# Patient Record
Sex: Female | Born: 1985 | Hispanic: Refuse to answer | Marital: Married
Health system: Midwestern US, Community
[De-identification: ages and names within clinical notes are randomized; demographics above are authoritative.]

## PROBLEM LIST (undated history)

## (undated) DIAGNOSIS — N926 Irregular menstruation, unspecified: Secondary | ICD-10-CM

---

## 2020-11-07 LAB — COMPREHENSIVE METABOLIC PANEL
ALT: 15 U/L (ref 0–35)
AST: 23 U/L (ref 0–35)
Albumin/Globulin Ratio: 1.6 (ref 1.00–2.70)
Albumin: 4.5 g/dL (ref 3.5–5.2)
Alk Phosphatase: 96 U/L (ref 35–117)
Anion Gap: 11 mmol/L (ref 2–17)
BUN: 7 mg/dL (ref 6–20)
Bun/Cre Ratio: 10 (ref 6.0–17.0)
CO2: 25 mmol/L (ref 22–29)
Calcium: 9.3 mg/dL (ref 8.6–10.0)
Chloride: 100 mmol/L (ref 98–107)
Creatinine: 0.7 mg/dL (ref 0.5–1.0)
Est, Glom Filt Rate: 116 mL/min/1.73m?? (ref >=90)
Globulin: 2.8 g/dL (ref 1.9–4.4)
Glucose: 90 mg/dL (ref 70–99)
OSMOLALITY CALCULATED: 269 mOsm/kg — ABNORMAL LOW (ref 270–287)
Potassium: 4.6 mmol/L (ref 3.5–5.3)
Sodium: 136 mmol/L (ref 135–145)
Total Bilirubin: 0.37 mg/dL (ref 0.00–1.20)
Total Protein: 7.3 g/dL (ref 6.4–8.3)

## 2020-11-07 LAB — CBC
Hematocrit: 40.7 % (ref 34.0–47.0)
Hemoglobin: 13.9 g/dL (ref 11.5–15.7)
MCH: 32.9 pg (ref 27.0–34.5)
MCHC: 34.2 g/dL (ref 32.0–36.0)
MCV: 96.4 fL (ref 81.0–99.0)
MPV: 12.1 fL (ref 7.2–13.2)
NRBC Absolute: 0 10*3/uL (ref 0.000–0.012)
NRBC Automated: 0 % (ref 0.0–0.2)
Platelets: 281 10*3/uL (ref 140–440)
RBC: 4.22 x10e6/mcL (ref 3.60–5.20)
RDW: 11.7 % (ref 11.0–16.0)
WBC: 8.8 10*3/uL (ref 3.8–10.6)

## 2020-11-07 LAB — TSH WITH REFLEX TO FT4: TSH: 2.38 mcIU/mL (ref 0.358–3.740)

## 2020-11-07 LAB — URINALYSIS W/ RFLX MICROSCOPIC
Bilirubin Urine: NEGATIVE
Blood, Urine: NEGATIVE
Glucose, UA: NEGATIVE
Ketones, Urine: NEGATIVE
Leukocyte Esterase, Urine: NEGATIVE
Nitrite, Urine: NEGATIVE
Protein, UA: NEGATIVE
Specific Gravity, UA: 1.025 (ref 1.003–1.035)
Urobilinogen, Urine: 0.2 EU/dL
pH, UA: 6 (ref 4.5–8.0)

## 2020-11-08 LAB — PROLACTIN: Prolactin: 11.4 ng/mL (ref 4.79–23.30)

## 2020-11-12 LAB — HCG, QUANTITATIVE, PREGNANCY: hCG Quant: <1 m[IU]/mL (ref 0.0–5.3)

## 2020-12-23 ENCOUNTER — Encounter

## 2020-12-24 ENCOUNTER — Ambulatory Visit: Admit: 2020-12-24 | Discharge: 2020-12-24 | Payer: PRIVATE HEALTH INSURANCE | Primary: Family Medicine

## 2020-12-24 ENCOUNTER — Ambulatory Visit
Admit: 2020-12-24 | Discharge: 2020-12-24 | Payer: PRIVATE HEALTH INSURANCE | Attending: Obstetrics & Gynecology | Primary: Family Medicine

## 2020-12-24 DIAGNOSIS — N926 Irregular menstruation, unspecified: Secondary | ICD-10-CM

## 2020-12-24 NOTE — Assessment & Plan Note (Addendum)
-   exercising daily and working on nutrition

## 2020-12-24 NOTE — Progress Notes (Signed)
Kim Hughes (DOB:  03/18/1986) is a 34 y.o.,Established patient, here for evaluation of the following chief complaint(s):  Ultrasound         ASSESSMENT/PLAN:  1. Irregular menses  Assessment & Plan:   improved, patient started cycle this month   [place] reviewed - uterus and ovaries appear normal  2. At risk for fertility problems  3. BMI 34.0-34.9,adult  Assessment & Plan:  - exercising daily and working on nutrition      No follow-ups on file.   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 Since last visit, patient had a day of spotting on 7/4 and then a normal period on 12/11/20 with 7 days of heavy bleeding. She notes bleeding was heavier than normal. She is going to [place] to do her fertility treatment and plans to have annual exam there in Sept. She is continuing to work on weight loss with exercise and dietary modifications.     Prior Hx:  - 12/04/20: Initial visit to discuss absent menses and fertility planning. She was having regular cycles until May. She missed her period June and thought she was pregnant as she had associated breast tenderness and bloating, but all UPTs were neg. She did not have a blood level checked. In July she had 3 days of spotting with heavier  spotting on the 2nd day. She was previously told that it would be difficult to get pregnant due to kidney problems that she was born with. She has a nephrologist, but has not had any renal imaging in several years. She has never had imaging of her pelvic organs. She drinks 3.5L water/day to avoid dehydration.     Fhx: father with thyroid cancer and testicular cancer.    Review of Systems  Review of Systems   Constitutional:  Negative for chills and fever.   HENT: Negative.     Eyes: Negative.    Respiratory: Negative.  Negative for cough and  shortness of breath.    Cardiovascular:  Negative for chest pain, palpitations and leg swelling.   Gastrointestinal:  Negative for abdominal pain, diarrhea, nausea and vomiting.   Endocrine: Negative for cold intolerance and heat intolerance.   Genitourinary:  Negative for difficulty urinating, dysuria, flank pain, frequency, hematuria and pelvic pain.   Musculoskeletal: Negative.    Psychiatric/Behavioral: Negative.            Objective   Vitals:    12/24/20 1002   BP: 115/79   Pulse: 76   Weight: 184 lb (83.5 kg)   Height: 5\' 1"  (1.549 m)       Physical Exam  Physical Exam  Constitutional:       Appearance: Normal appearance.   HENT:      Head: Normocephalic and atraumatic.   Cardiovascular:      Rate and Rhythm: Normal rate.   Abdominal:      General: There is no distension.      Palpations: There is no mass.      Tenderness: There is no abdominal tenderness.   Neurological:      Mental Status: She is alert.   Psychiatric:         Mood and Affect: Mood normal.         Behavior: Behavior normal.  An electronic signature was used to authenticate this note.    --Carolyne Littles, MD

## 2020-12-24 NOTE — Assessment & Plan Note (Signed)
improved, patient started cycle this month   [place] reviewed - uterus and ovaries appear normal

## 2022-10-08 IMAGING — MR MRI KNEE LT W/O CONTRAST
9 series · 40 of 40 positions shown · non-contrast
Comparison: none

﻿

MAGNETIC RESONANCE IMAGING OF THE LEFT KNEE WITHOUT INTRAVENOUS CONTRAST ADMINISTRATION
HISTORY: Knee pain.  Anterior knee pain 6 months post-fall.
TECHNIQUE: Multiplanar magnetic resonance imaging of the left knee was accomplished employing surface coil and an open 0.3Christiti Sundhu scanner without intravenous contrast administration.  T1-weighted, intermediate intensity, and T2-weighted thin slice sections obtained.

[Series 1: t/s/c scano · axial · left · 6.0mm · 0.94mm/px · z∈[-16,+120]mm · 2 of 15 slices shown]
[im 1/15]
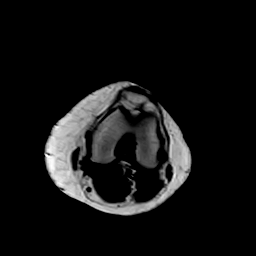
[im 15/15]
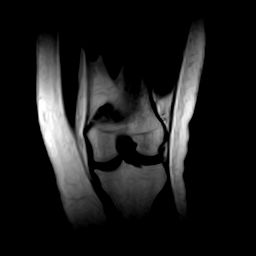

[Series 2: T2 · axial · left · 4.0mm · 0.70mm/px · z∈[-92,+22]mm · 5 of 24 slices shown (1 of 2)]
[im 1/24]
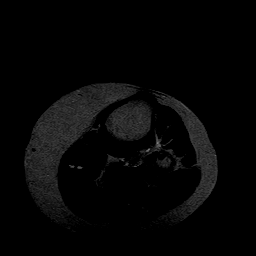
[im 6/24]
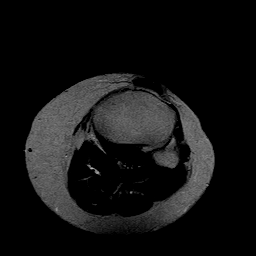
[im 12/24]
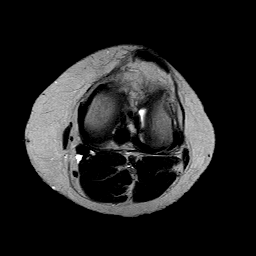
[im 18/24]
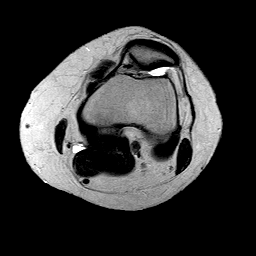
[im 24/24]
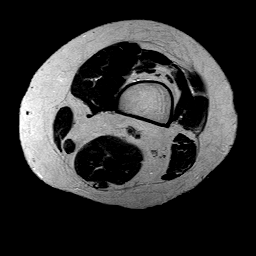

[Series 3: T1 · coronal · left · 4.0mm · 0.35mm/px · 4 of 22 slices shown]
[im 1/22]
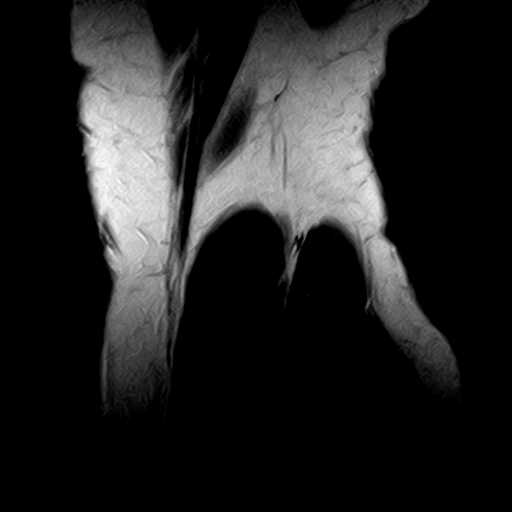
[im 8/22]
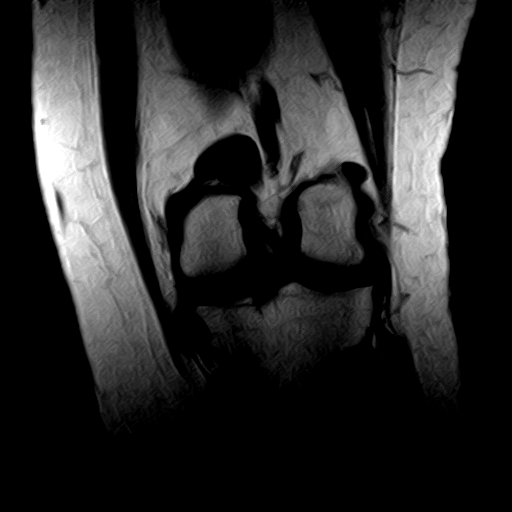
[im 15/22]
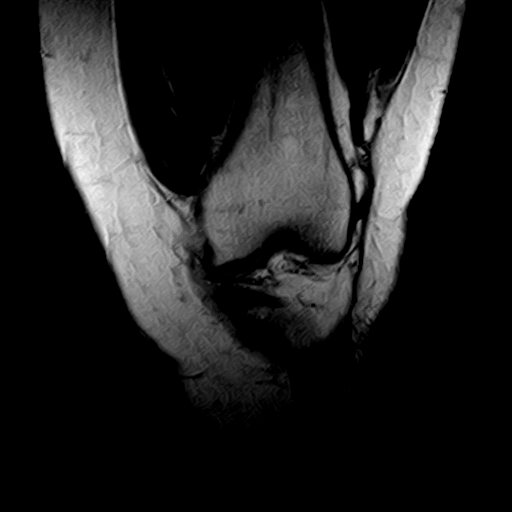
[im 22/22]
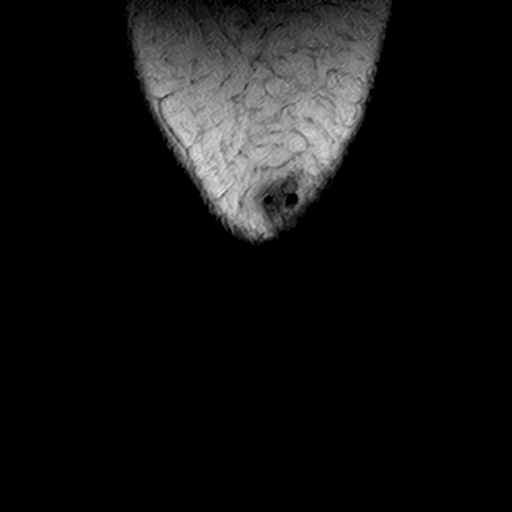

[Series 4: shim sag · sagittal · left · 10.0mm · 4.69mm/px · 1 of 6 slices shown]
[im 1/6]
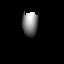

[Series 5: fatsepg mtc cor · coronal · left · 4.0mm · 0.70mm/px · 9 of 44 slices shown]
[im 1/44]
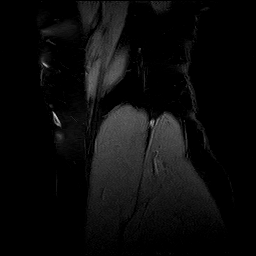
[im 6/44]
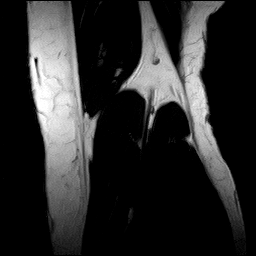
[im 11/44]
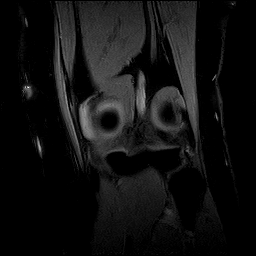
[im 17/44]
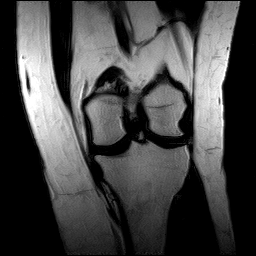
[im 22/44]
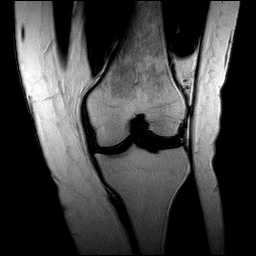
[im 27/44]
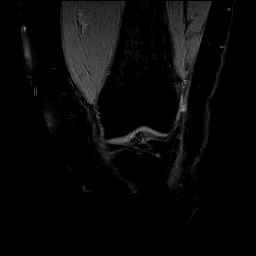
[im 33/44]
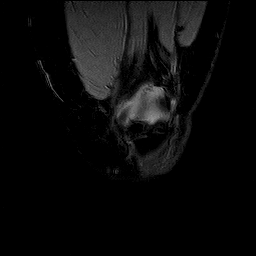
[im 38/44]
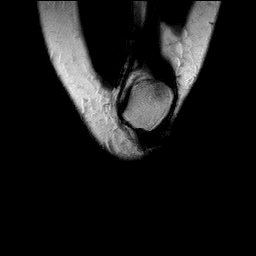
[im 44/44]
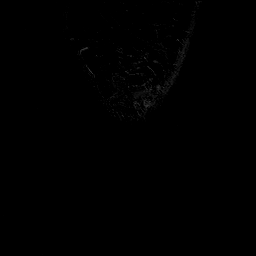

[Series 6: T2 · sagittal · left · 4.0mm · 0.70mm/px · 5 of 24 slices shown (2 of 2)]
[im 1/24]
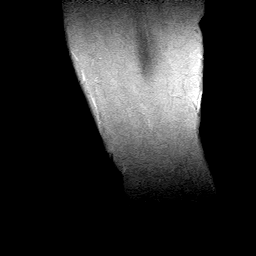
[im 6/24]
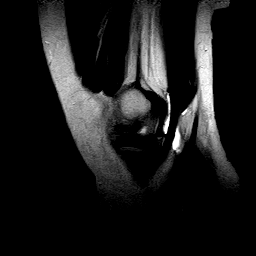
[im 12/24]
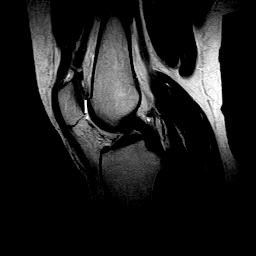
[im 18/24]
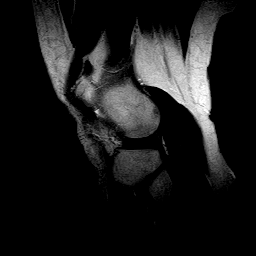
[im 24/24]
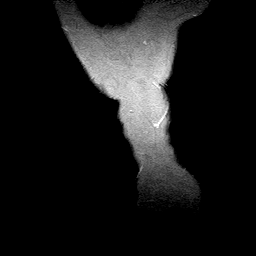

[Series 7: (id) · coronal · left · 4.0mm · 0.70mm/px · 4 of 22 slices shown]
[im 1/22]
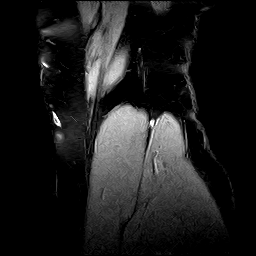
[im 8/22]
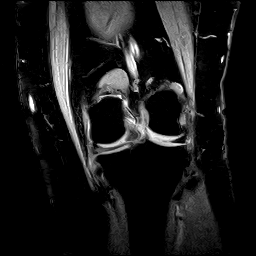
[im 15/22]
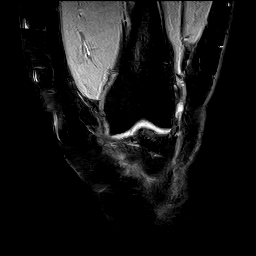
[im 22/22]
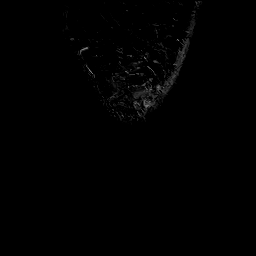

[Series 8: PD · sagittal · left · 4.0mm · 0.35mm/px · 5 of 24 slices shown]
[im 1/24]
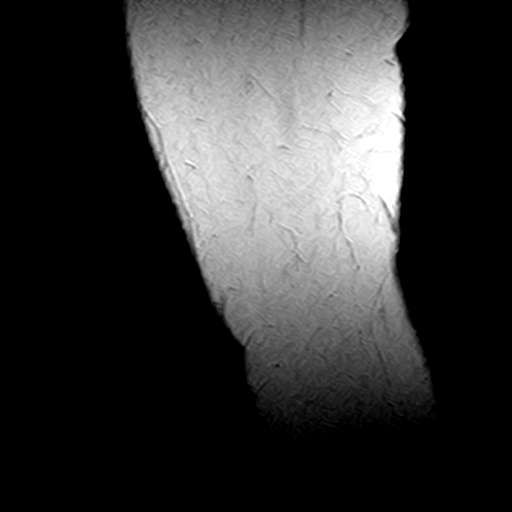
[im 6/24]
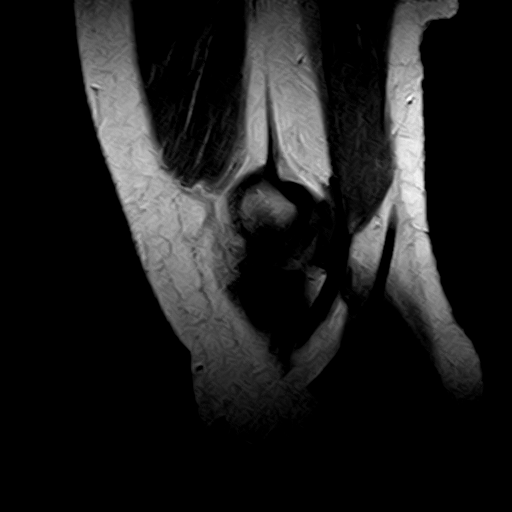
[im 12/24]
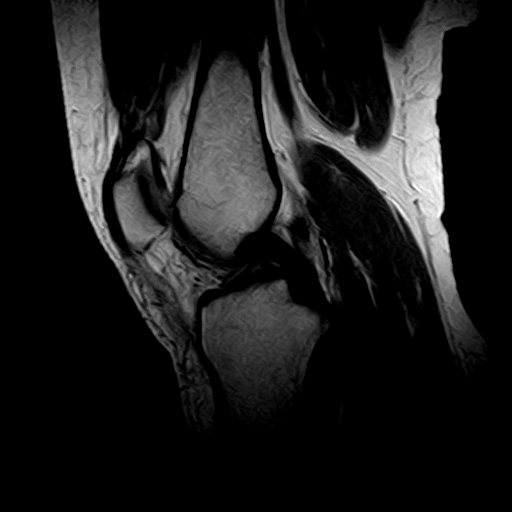
[im 18/24]
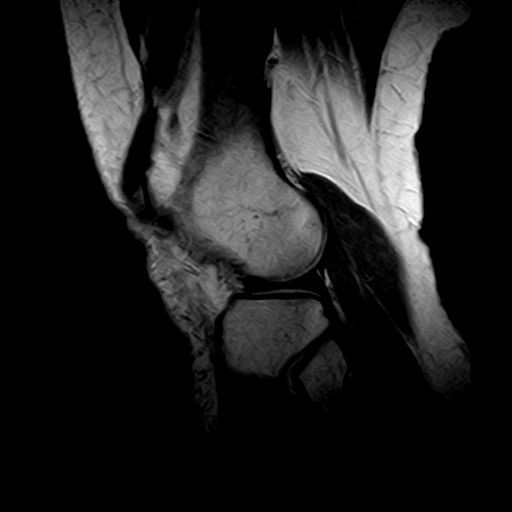
[im 24/24]
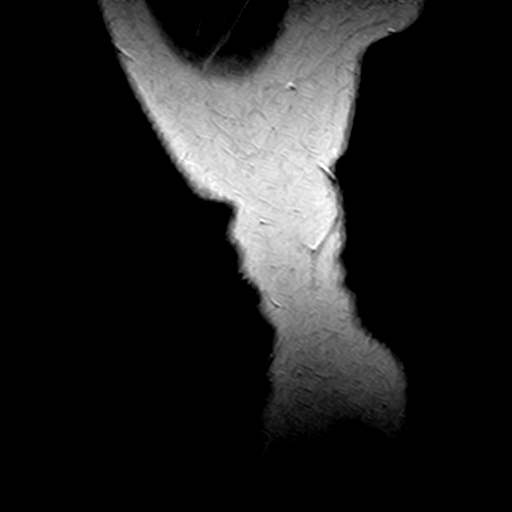

[Series 9: fir deq sag · sagittal · left · 4.0mm · 0.70mm/px · 5 of 23 slices shown]
[im 1/23]
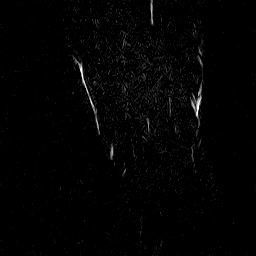
[im 6/23]
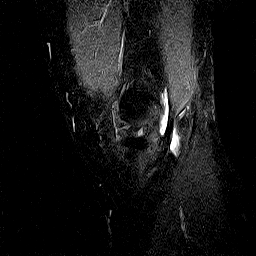
[im 12/23]
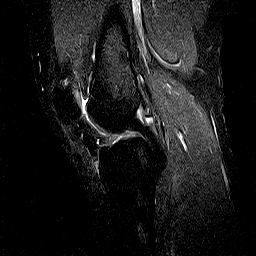
[im 17/23]
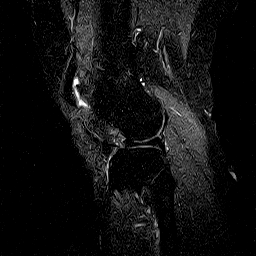
[im 23/23]
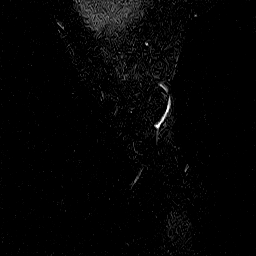

[40 of 40 positions shown; findings below may reference images not displayed]

FINDINGS: No prior studies are available for comparison purposes.  

Small nodular foci of diminished T1 and T2 signal intensity within the subcutaneous fat anterior to mid patella.  Findings may represent fibrotic change alone.  If patient has history of penetrating injury, then correlation with radiographs and ultrasound would be advised to exclude foreign body.  No metallic artifact.  There is mild proximal patellar tendinitis/tendinosis and/or contusion.  No evidence for patellar tendon rupture.  Quadriceps tendon unremarkable.  There are no bursal effusions.  

I do not identify degenerative change disproportionate to patient age.   No abnormalities of regional bone marrow signal to indicate occult osseous trauma/fracture.  There is a small knee joint effusion and associated Baker’s cyst.  The anterior and posterior cruciate ligaments, as well as the medial and lateral collateral ligaments are intact and unremarkable.  No evidence for focal meniscal tear.  The iliotibial band appears normal.
IMPRESSION: 1. Small knee joint effusion and popliteal fossa/Baeker’s cyst. 

2. No MRI evidence for ligamentous or meniscal tear. 

3. Small indeterminate foci of diminished signal within subcutaneous fat anterior to mid patella as described.  There appears to be mild proximal patellar tendinitis/tendinosis or contusion. 

4. Please see in-depth discussion in body of report.
